# Patient Record
Sex: Male | Born: 1990 | Race: Black or African American | Hispanic: No | Marital: Single | Smoking: Current every day smoker
Health system: Southern US, Community
[De-identification: ages and names within clinical notes are randomized; demographics above are authoritative.]

## PROBLEM LIST (undated history)

## (undated) ENCOUNTER — Emergency Department: Admission: EM | Payer: No Typology Code available for payment source

## (undated) DIAGNOSIS — E119 Type 2 diabetes mellitus without complications: Secondary | ICD-10-CM

---
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
## 2012-08-19 ENCOUNTER — Emergency Department (HOSPITAL_COMMUNITY): Payer: Medicaid Other

## 2012-08-19 ENCOUNTER — Emergency Department (HOSPITAL_COMMUNITY)
Admission: EM | Admit: 2012-08-19 | Discharge: 2012-08-19 | Disposition: A | Discharge status: Home / Self Care | Payer: Medicaid Other | Attending: Emergency Medicine | Admitting: Emergency Medicine

## 2012-08-19 ENCOUNTER — Encounter (HOSPITAL_COMMUNITY): Payer: Self-pay | Admitting: Emergency Medicine

## 2012-08-19 DIAGNOSIS — R599 Enlarged lymph nodes, unspecified: Secondary | ICD-10-CM | POA: Insufficient documentation

## 2012-08-19 DIAGNOSIS — D72829 Elevated white blood cell count, unspecified: Secondary | ICD-10-CM

## 2012-08-19 DIAGNOSIS — N50819 Testicular pain, unspecified: Secondary | ICD-10-CM

## 2012-08-19 DIAGNOSIS — N509 Disorder of male genital organs, unspecified: Secondary | ICD-10-CM | POA: Insufficient documentation

## 2012-08-19 DIAGNOSIS — F17200 Nicotine dependence, unspecified, uncomplicated: Secondary | ICD-10-CM | POA: Insufficient documentation

## 2012-08-19 DIAGNOSIS — R739 Hyperglycemia, unspecified: Secondary | ICD-10-CM

## 2012-08-19 DIAGNOSIS — R7309 Other abnormal glucose: Secondary | ICD-10-CM | POA: Insufficient documentation

## 2012-08-19 DIAGNOSIS — R61 Generalized hyperhidrosis: Secondary | ICD-10-CM | POA: Insufficient documentation

## 2012-08-19 LAB — URINALYSIS, ROUTINE W REFLEX MICROSCOPIC
Glucose, UA: 100 mg/dL — AB
Leukocytes, UA: NEGATIVE
Nitrite: NEGATIVE
Specific Gravity, Urine: 1.031 — ABNORMAL HIGH (ref 1.005–1.030)
pH: 5.5 (ref 5.0–8.0)

## 2012-08-19 LAB — BASIC METABOLIC PANEL
BUN: 10 mg/dL (ref 6–23)
CO2: 28 mEq/L (ref 19–32)
Chloride: 101 mEq/L (ref 96–112)
GFR calc Af Amer: >90 mL/min (ref >90)
Potassium: 4 mEq/L (ref 3.5–5.1)

## 2012-08-19 LAB — CBC WITH DIFFERENTIAL/PLATELET
Basophils Relative: 0 % (ref 0–1)
Eosinophils Absolute: 0.5 10*3/uL (ref 0.0–0.7)
Eosinophils Relative: 4 % (ref 0–5)
Hemoglobin: 14 g/dL (ref 13.0–17.0)
Lymphocytes Relative: 34 % (ref 12–46)
MCHC: 33.9 g/dL (ref 30.0–36.0)
Monocytes Relative: 6 % (ref 3–12)
Neutrophils Relative %: 56 % (ref 43–77)
RBC: 5.11 MIL/uL (ref 4.22–5.81)
WBC: 12.7 10*3/uL — ABNORMAL HIGH (ref 4.0–10.5)

## 2012-08-19 LAB — HEMOGLOBIN A1C: Hgb A1c MFr Bld: 8.4 % — ABNORMAL HIGH (ref <5.7)

## 2012-08-19 LAB — GLUCOSE, CAPILLARY

## 2012-08-19 MED ORDER — CIPROFLOXACIN HCL 500 MG PO TABS: 500.0000 mg | ORAL_TABLET | Freq: Two times a day (BID) | ORAL | Status: DC

## 2012-08-19 MED ORDER — CEFTRIAXONE SODIUM 1 G IJ SOLR
1.0000 g | Freq: Once | INTRAMUSCULAR | Status: AC
  Administered 2012-08-19: 1 g via INTRAMUSCULAR
  Filled 2012-08-19: qty 10

## 2012-08-19 MED ORDER — LIDOCAINE HCL (PF) 1 % IJ SOLN
INTRAMUSCULAR | Status: AC
  Filled 2012-08-19: qty 5

## 2012-08-19 MED ORDER — AZITHROMYCIN 250 MG PO TABS
1000.0000 mg | ORAL_TABLET | Freq: Once | ORAL | Status: AC
  Administered 2012-08-19: 1000 mg via ORAL
  Filled 2012-08-19: qty 4

## 2012-08-19 NOTE — ED Notes (Signed)
Pt returned back from US.

## 2012-08-19 NOTE — ED Notes (Signed)
Pt here with c/o testicular pain that started 5 months ago no swelling, no injury

## 2012-08-19 NOTE — ED Provider Notes (Signed)
History     CSN: [id number]  Arrival date & time 08/19/12  0907   First MD Initiated Contact with Patient 08/19/12 [phone]      Chief Complaint  Patient presents with  . Testicle Pain    (Consider location/radiation/quality/duration/timing/severity/associated sxs/prior treatment) HPI Comments: Alexander Kelley 21 y.o. male   The chief complaint is: Patient presents with:   Testicle Pain    21 year old male presents with chief complaint of testicular pain.  Onset was 5 months ago.  Pain is intermittent.  Never above 5/10.  Pain does not radiate.  Patient denies any mechanism of injury.  Last sexual encounter was 1 year ago.  Patient denies any discharge from the penis, burning with urination, difficulty with urination.  He has noticed some "bumps" in his groin area to denies any vesicular eruptions are painless ulcerations appear.  Patient states that his pain is along the spermatic cords.  He also states that he has daily soaking night sweats, but denies any unexplained weight loss, excessive fatigue.  He denies history of cryptorchidism.    Patient is a 21 y.o. male presenting with testicular pain. The history is provided by the patient. No language interpreter was used.  Testicle Pain The current episode started more than 1 month ago. The problem occurs intermittently. The problem has been gradually improving. Associated symptoms include swollen glands. Pertinent negatives include no abdominal pain, anorexia, arthralgias, change in bowel habit, chest pain, chills, congestion, coughing, diaphoresis, fatigue, fever, headaches, joint swelling, myalgias, nausea, neck pain, numbness, rash, sore throat, urinary symptoms, vertigo, visual change, vomiting or weakness. Nothing aggravates the symptoms. He has tried nothing for the symptoms.    History reviewed. No pertinent past medical history.  History reviewed. No pertinent past surgical history.  History reviewed. No pertinent family  history.  History  Substance Use Topics  . Smoking status: Current Every Day Smoker  . Smokeless tobacco: Not on file  . Alcohol Use: No      Review of Systems  Constitutional: Negative for fever, chills, diaphoresis and fatigue.  HENT: Negative for congestion, sore throat and neck pain.   Respiratory: Negative for cough.   Cardiovascular: Negative for chest pain.  Gastrointestinal: Negative for nausea, vomiting, abdominal pain, anorexia and change in bowel habit.  Genitourinary: Positive for testicular pain.  Musculoskeletal: Negative for myalgias, joint swelling and arthralgias.  Skin: Negative for rash.  Neurological: Negative for vertigo, weakness, numbness and headaches.  All other systems reviewed and are negative.    Allergies  Review of patient's allergies indicates no known allergies.  Home Medications  No current outpatient prescriptions on file.  BP 143/65  Pulse 79  Temp 97.8 F (36.6 C)  Resp 18  SpO2 100%  Physical Exam  Nursing note and vitals reviewed. Constitutional: He appears well-developed and well-nourished. No distress.  HENT:  Head: Normocephalic and atraumatic.  Eyes: Conjunctivae normal are normal. No scleral icterus.  Neck: Normal range of motion. Neck supple.  Cardiovascular: Normal rate, regular rhythm and normal heart sounds.   Pulmonary/Chest: Effort normal and breath sounds normal. No respiratory distress.  Abdominal: Soft. There is no tenderness. Hernia confirmed negative in the right inguinal area and confirmed negative in the left inguinal area.  Genitourinary: Testes normal and penis normal. Cremasteric reflex is present. Right testis shows no mass, no swelling and no tenderness. Right testis is descended. Left testis shows no mass, no swelling and no tenderness. Left testis is descended. Circumcised. No phimosis, paraphimosis, hypospadias, penile erythema  or penile tenderness. No discharge found.       No erythema or discharge  present in the urethral opening. Patient does have palpable lymph nodes in the inguinal chain.  No sores.  No tenderness of the epididymis or spermatic cord.  Testicles are equal in size without tenderness or masses. No palpable inguinal hernias  Musculoskeletal: He exhibits no edema.  Lymphadenopathy:       Right: Inguinal adenopathy present.       Left: Inguinal adenopathy present.  Neurological: He is alert.  Skin: Skin is warm and dry. He is not diaphoretic.  Psychiatric: His behavior is normal.    ED Course  Procedures (including critical care time)  Labs Reviewed  URINALYSIS, ROUTINE W REFLEX MICROSCOPIC - Abnormal; Notable for the following:    Specific Gravity, Urine 1.031 (*)     Glucose, UA 100 (*)     All other components within normal limits  CBC WITH DIFFERENTIAL - Abnormal; Notable for the following:    WBC 12.7 (*)     Lymphs Abs 4.3 (*)     All other components within normal limits  BASIC METABOLIC PANEL - Abnormal; Notable for the following:    Glucose, Bld 271 (*)     All other components within normal limits  GLUCOSE, CAPILLARY - Abnormal; Notable for the following:    Glucose-Capillary 260 (*)     All other components within normal limits  HEMOGLOBIN A1C - Abnormal; Notable for the following:    Hemoglobin A1C 8.4 (*)     Mean Plasma Glucose 194 (*)     All other components within normal limits  LAB REPORT - SCANNED   No results found.   1. Testicular pain   2. Hyperglycemia without ketosis   3. Leukocytosis       MDM  Patient with c/o testicular pain.  His [place] and doppler are negative for any acute abnormality.  Patient does have highly elevated glucose and mother reports strong family history of Diabetes.  I have ordered a HGB A1C which is still pending.  The patient is uninsured and without PCP. I have asked The flow manager to  All the patient with his A1C results  [place].  He will need to return to [facility] in 2 days for f/u and recheck of his  blood glucose. Patient expresses understanding and agrees with plan.        Arthor Captain, PA-C 08/21/12 2000

## 2012-08-19 NOTE — ED Notes (Signed)
Patient transported to Ultrasound 

## 2012-08-19 NOTE — ED Notes (Signed)
Pt's CBG is 260. RN [place] notified.

## 2012-08-19 NOTE — ED Notes (Signed)
Patient informed that Hemoglobin A1C-is 8.4. Patient was instructed to f/u at Urgent Care. Patient stated," he will f/u tomorrow".

## 2012-08-20 ENCOUNTER — Emergency Department (HOSPITAL_COMMUNITY)
Admission: EM | Admit: 2012-08-20 | Discharge: 2012-08-20 | Disposition: A | Discharge status: Home / Self Care | Payer: Medicaid Other | Attending: Emergency Medicine | Admitting: Emergency Medicine

## 2012-08-20 ENCOUNTER — Encounter (HOSPITAL_COMMUNITY): Payer: Self-pay

## 2012-08-20 DIAGNOSIS — E119 Type 2 diabetes mellitus without complications: Secondary | ICD-10-CM

## 2012-08-20 DIAGNOSIS — F17200 Nicotine dependence, unspecified, uncomplicated: Secondary | ICD-10-CM | POA: Insufficient documentation

## 2012-08-20 HISTORY — DX: Type 2 diabetes mellitus without complications: E11.9

## 2012-08-20 LAB — GLUCOSE, CAPILLARY: Glucose-Capillary: 233 mg/dL — ABNORMAL HIGH (ref 70–99)

## 2012-08-20 MED ORDER — METFORMIN HCL 1000 MG PO TABS: 500.0000 mg | ORAL_TABLET | Freq: Two times a day (BID) | ORAL | Status: DC

## 2012-08-20 NOTE — ED Provider Notes (Signed)
History     CSN: [id number]  Arrival date & time 08/20/12  0934   First MD Initiated Contact with Patient 08/20/12 [phone]      No chief complaint on file.   (Consider location/radiation/quality/duration/timing/severity/associated sxs/prior treatment) The history is provided by the patient and a parent. No language interpreter was used.  cc:  Patient here today to have  blood sugar rechecked today.  He was seen in the ER yesterday for testicular pain and put on cipro after u/s r/o testicular torsion.  Incidental findings were hgb AIC elevated.  Patient was told to go to the urgent care for a recheck of his blood sugar today. At urgent care was closed that he came to the ER. Patient has no PCP. Patient's father has diabetic diabetes and is on insulin. Patient is a smoker with no other past medical history. Patient denies nausea vomiting abdominal pain, headaches, muscle aching, polyuria, polydipsia, polyphagia.Marland Kitchen No past medical history on file.  No past surgical history on file.  No family history on file.  History  Substance Use Topics  . Smoking status: Current Every Day Smoker  . Smokeless tobacco: Not on file  . Alcohol Use: No      Review of Systems  Constitutional: Negative.   HENT: Negative.   Eyes: Negative.   Respiratory: Negative.   Cardiovascular: Negative.   Gastrointestinal: Negative.   Neurological: Negative.   Psychiatric/Behavioral: Negative.   All other systems reviewed and are negative.    Allergies  Review of patient's allergies indicates no known allergies.  Home Medications   Current Outpatient Rx  Name  Route  Sig  Dispense  Refill  . CIPROFLOXACIN HCL 500 MG PO TABS   Oral   Take 500 mg by mouth 2 (two) times daily. For 10 days starting on 08/19/12           BP 126/71  Pulse 82  Temp 97.5 F (36.4 C) (Oral)  Resp 20  SpO2 95%  Physical Exam  Nursing note and vitals reviewed. Constitutional: He is oriented to person, place, and  time. He appears well-developed and well-nourished.  HENT:  Head: Normocephalic.  Eyes: Conjunctivae normal and EOM are normal. Pupils are equal, round, and reactive to light.  Neck: Normal range of motion. Neck supple.  Cardiovascular: Normal rate.        No tachycardia  Pulmonary/Chest: Effort normal.       No hyper Ventilation  Abdominal: Soft.  Musculoskeletal: Normal range of motion.  Neurological: He is alert and oriented to person, place, and time.  Skin: Skin is warm and dry.  Psychiatric: He has a normal mood and affect.    ED Course  Procedures (including critical care time)   Labs Reviewed  CBC  BASIC METABOLIC PANEL  URINALYSIS, ROUTINE W REFLEX MICROSCOPIC   US Scrotum  08/19/2012  *RADIOLOGY REPORT*  Clinical Data:  21 year old with testicular discomfort.  SCROTAL ULTRASOUND DOPPLER ULTRASOUND OF THE TESTICLES  Technique: Complete ultrasound examination of the testicles, epididymis, and other scrotal structures was performed.  Color and spectral Doppler ultrasound were also utilized to evaluate blood flow to the testicles.  Comparison:  None  Findings:  Right testis:  Normal echotexture to the right testicle.  No evidence for a testicular mass.  The right testicle measures 4.7 x 2.4 x 3.5 cm.  Small amount of fluid around the right testicle.  Left testis:  Normal appearance of the left testicle without a testicular mass.  Left testicle measures  4.6 x 2.3 x 3.2 cm.  Small amount of fluid around the left testicle.  Right epididymis:  Normal in size and appearance.  Left epididymis:  Normal in size and appearance.  Hydrocele:  Small bilateral hydroceles.  Varicocele:  Absent  Pulsed Doppler interrogation of both testes demonstrates low resistance flow bilaterally.  IMPRESSION: Normal testicular ultrasound.  Small bilateral hydroceles.  No evidence for testicular torsion.   Original Report Authenticated By: Richarda Overlie, M.D.    [place] Art/ven Flow Abd Pelv Doppler  08/19/2012   *RADIOLOGY REPORT*  Clinical Data:  21 year old with testicular discomfort.  SCROTAL ULTRASOUND DOPPLER ULTRASOUND OF THE TESTICLES  Technique: Complete ultrasound examination of the testicles, epididymis, and other scrotal structures was performed.  Color and spectral Doppler ultrasound were also utilized to evaluate blood flow to the testicles.  Comparison:  None  Findings:  Right testis:  Normal echotexture to the right testicle.  No evidence for a testicular mass.  The right testicle measures 4.7 x 2.4 x 3.5 cm.  Small amount of fluid around the right testicle.  Left testis:  Normal appearance of the left testicle without a testicular mass.  Left testicle measures 4.6 x 2.3 x 3.2 cm.  Small amount of fluid around the left testicle.  Right epididymis:  Normal in size and appearance.  Left epididymis:  Normal in size and appearance.  Hydrocele:  Small bilateral hydroceles.  Varicocele:  Absent  Pulsed Doppler interrogation of both testes demonstrates low resistance flow bilaterally.  IMPRESSION: Normal testicular ultrasound.  Small bilateral hydroceles.  No evidence for testicular torsion.   Original Report Authenticated By: Richarda Overlie, M.D.      No diagnosis found.    MDM  Seen yesterday and put on Cipro for testicular pain. Incidental finding was hyperglycemia and ketoacidosis. Patient was here today to have his sugar rechecked.. We will start him on metformin 500 twice a day. Patient will followup with a PCP from the list as soon as possible. He will also keep a record of his CBGs. Patient will return for worsening symptoms such as nausea vomiting abdominal pain hyperventilation. It on diet in management of diabetes while he was in the ER today.         Remi Haggard, NP 08/20/12 1056

## 2012-08-20 NOTE — ED Notes (Signed)
Pt was told to follow up at [facility] today for HGB A1C 8.4.  Pt presents here today for the recheck.

## 2012-08-20 NOTE — ED Provider Notes (Signed)
Medical screening examination/treatment/procedure(s) were performed by non-physician practitioner and as supervising physician I was immediately available for consultation/collaboration.   Gavin Pound. Alekai Pocock, MD 08/20/12 1057

## 2012-08-24 NOTE — ED Provider Notes (Signed)
Medical screening examination/treatment/procedure(s) were performed by non-physician practitioner and as supervising physician I was immediately available for consultation/collaboration.    Morrie Daywalt L Dekendrick Uzelac, MD 08/24/12 1911 

## 2013-07-26 IMAGING — US US ART/VEN ABD/PELV/SCROTUM DOPPLER LTD
1 series · 14 of 25 positions shown · non-contrast
Comparison: None

CLINICAL DATA: 21-year-old with testicular discomfort.

SCROTAL ULTRASOUND
DOPPLER ULTRASOUND OF THE TESTICLES
TECHNIQUE: Complete ultrasound examination of the testicles,
epididymis, and other scrotal structures was performed.  Color and
spectral Doppler ultrasound were also utilized to evaluate blood
flow to the testicles.

[Series 1: us art/ven abd/pelv/scrotum doppler ltd · 0.08mm/px · 14 of 43 slices shown]
[im 1/43]
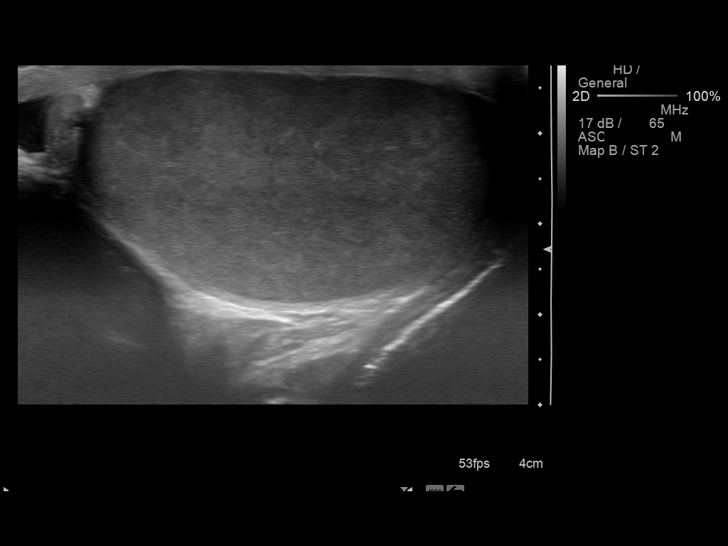
[im 4/43]
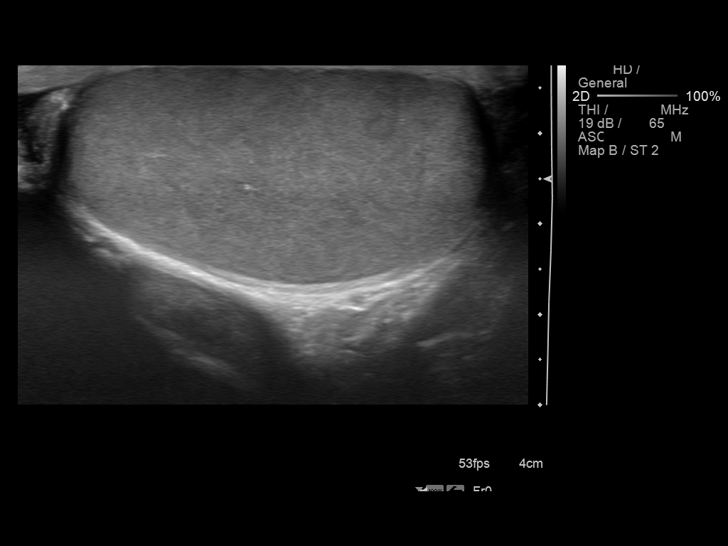
[im 8/43]
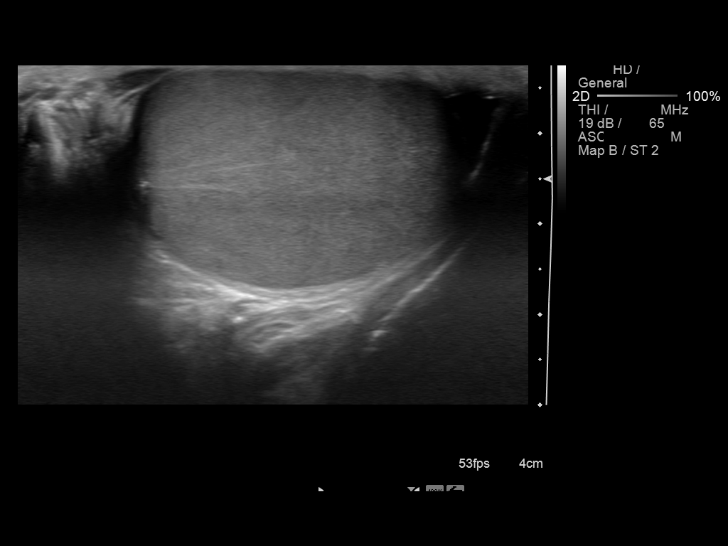
[im 11/43]
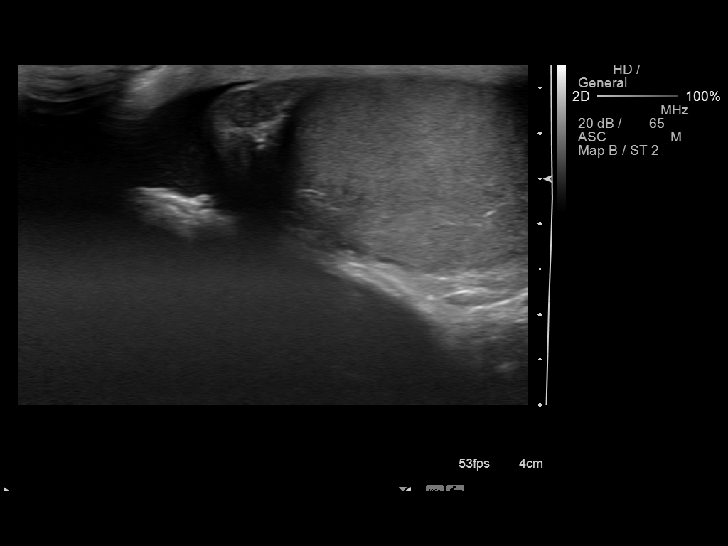
[im 15/43]
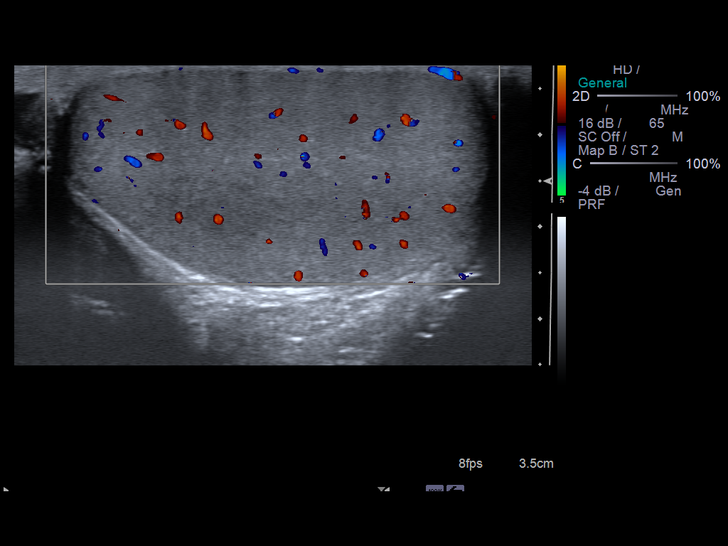
[im 16/43]
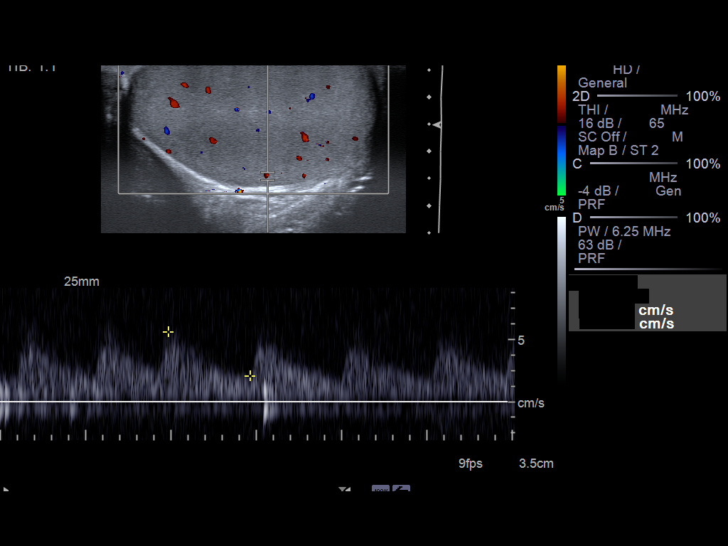
[im 20/43]
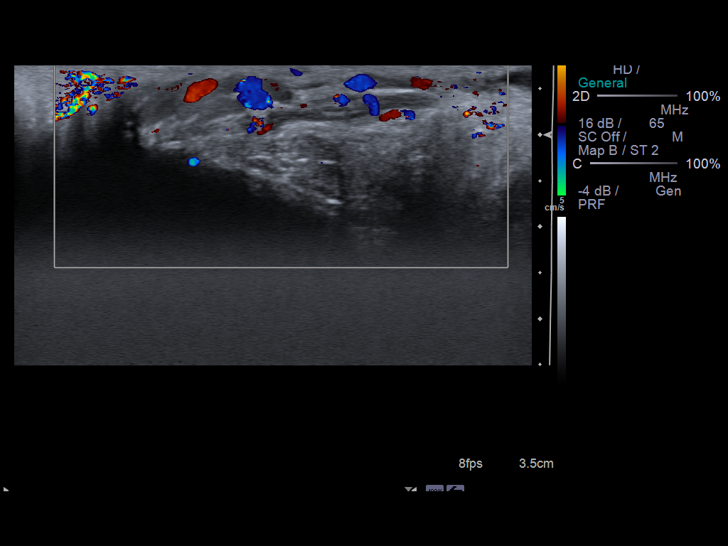
[im 23/43]
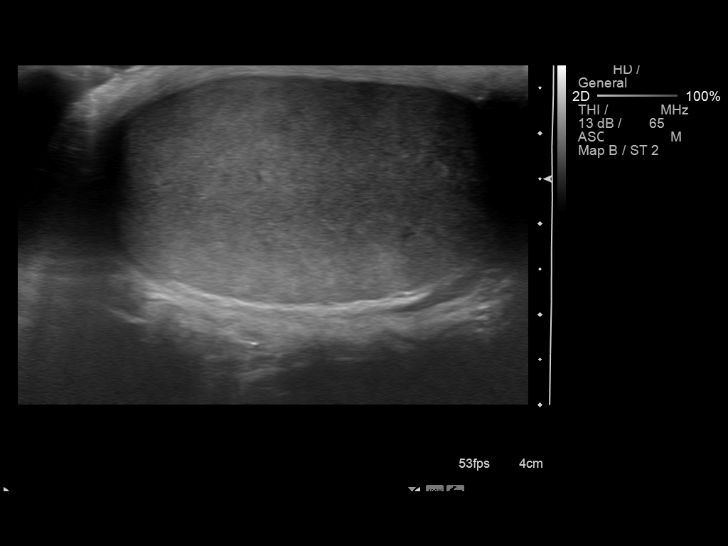
[im 27/43]
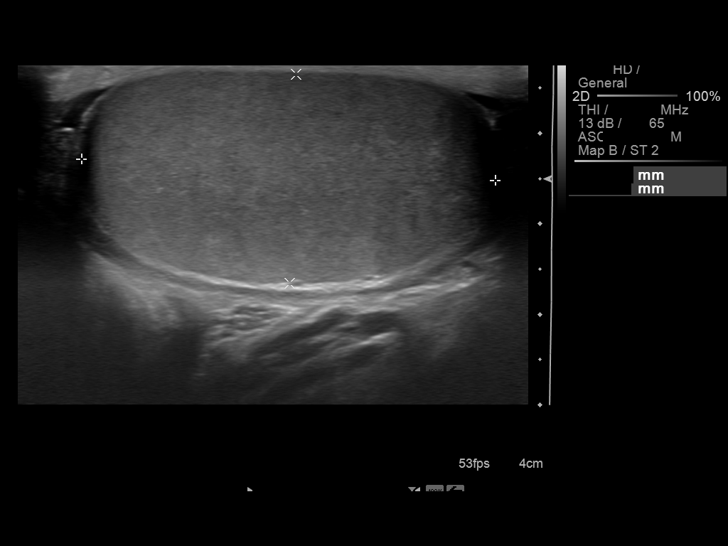
[im 29/43]
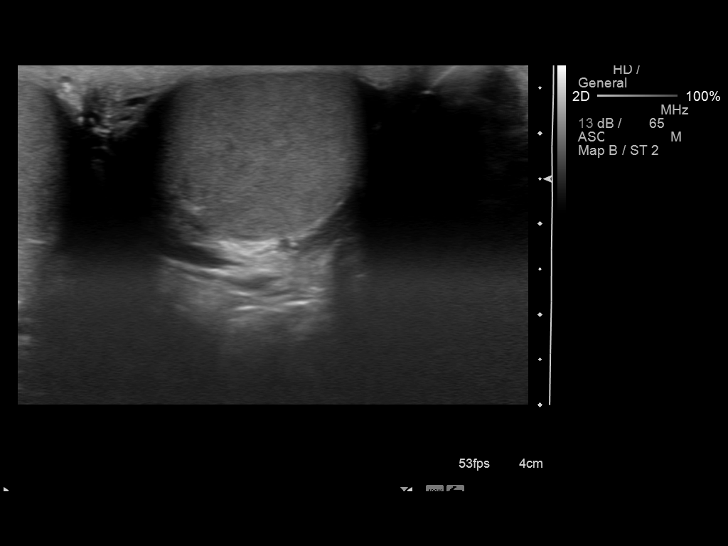
[im 32/43]
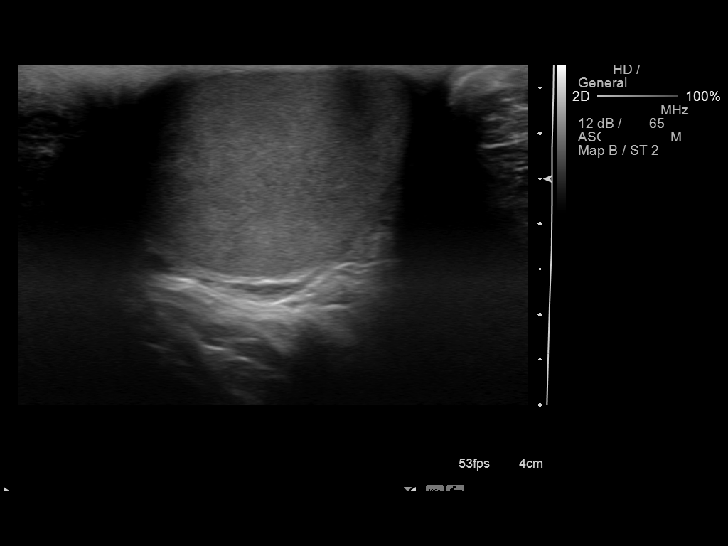
[im 36/43]
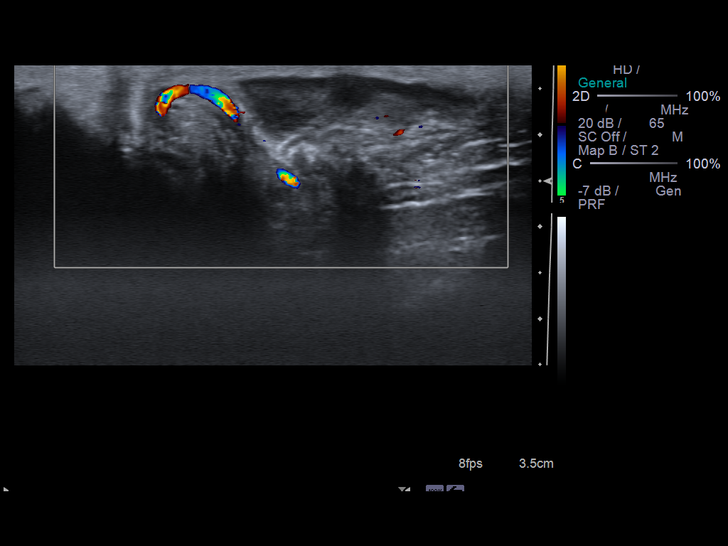
[im 39/43]
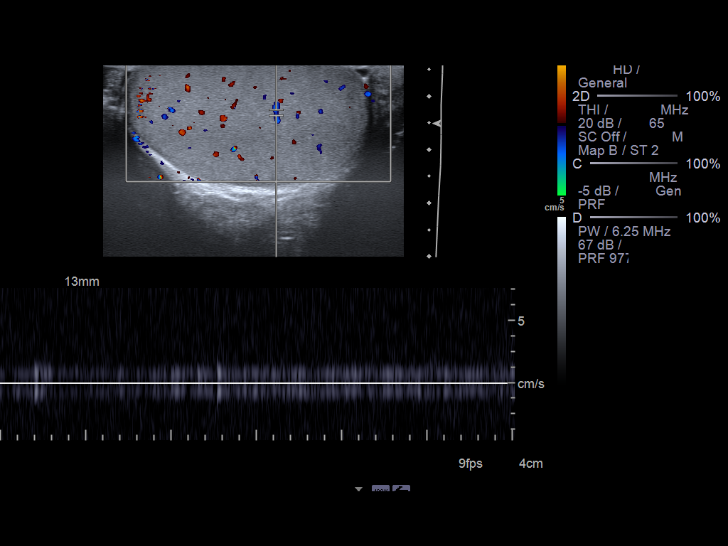
[im 43/43]
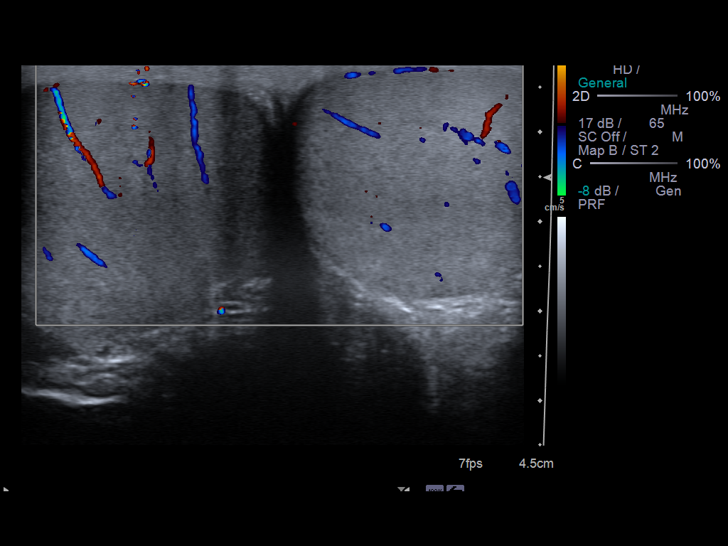

[14 of 25 positions shown; findings below may reference images not displayed]

FINDINGS: Right testis:  Normal echotexture to the right testicle.  No
evidence for a testicular mass.  The right testicle measures 4.7 x
2.4 x 3.5 cm.  Small amount of fluid around the right testicle.

Left testis:  Normal appearance of the left testicle without a
testicular mass.  Left testicle measures 4.6 x 2.3 x 3.2 cm.  Small
amount of fluid around the left testicle.

Right epididymis:  Normal in size and appearance.

Left epididymis:  Normal in size and appearance.

Hydrocele:  Small bilateral hydroceles.

Varicocele:  Absent

Pulsed Doppler interrogation of both testes demonstrates low
resistance flow bilaterally.
IMPRESSION: Normal testicular ultrasound.  Small bilateral hydroceles.

No evidence for testicular torsion.

## 2019-01-11 ENCOUNTER — Encounter: Payer: Self-pay | Admitting: Emergency Medicine

## 2019-01-11 ENCOUNTER — Emergency Department
Admission: EM | Admit: 2019-01-11 | Discharge: 2019-01-11 | Disposition: A | Discharge status: Home / Self Care | Payer: Self-pay | Attending: Emergency Medicine | Admitting: Emergency Medicine

## 2019-01-11 ENCOUNTER — Other Ambulatory Visit: Payer: Self-pay

## 2019-01-11 DIAGNOSIS — K0889 Other specified disorders of teeth and supporting structures: Secondary | ICD-10-CM

## 2019-01-11 DIAGNOSIS — K0381 Cracked tooth: Secondary | ICD-10-CM | POA: Insufficient documentation

## 2019-01-11 DIAGNOSIS — S025XXA Fracture of tooth (traumatic), initial encounter for closed fracture: Secondary | ICD-10-CM

## 2019-01-11 MED ORDER — HYDROCODONE-ACETAMINOPHEN 5-325 MG PO TABS: 1.0000 | ORAL_TABLET | Freq: Four times a day (QID) | ORAL | 0 refills | Status: DC | PRN

## 2019-01-11 MED ORDER — AMOXICILLIN 500 MG PO CAPS
500.0000 mg | ORAL_CAPSULE | Freq: Once | ORAL | Status: AC
  Administered 2019-01-11: 500 mg via ORAL
  Filled 2019-01-11: qty 1

## 2019-01-11 MED ORDER — IBUPROFEN 600 MG PO TABS: 600.0000 mg | ORAL_TABLET | Freq: Three times a day (TID) | ORAL | 0 refills | Status: DC | PRN

## 2019-01-11 MED ORDER — IBUPROFEN 600 MG PO TABS
600.0000 mg | ORAL_TABLET | Freq: Once | ORAL | Status: AC
  Administered 2019-01-11: 600 mg via ORAL
  Filled 2019-01-11: qty 1

## 2019-01-11 MED ORDER — HYDROCODONE-ACETAMINOPHEN 5-325 MG PO TABS
1.0000 | ORAL_TABLET | Freq: Once | ORAL | Status: AC
  Administered 2019-01-11: 06:00:00 1 via ORAL
  Filled 2019-01-11: qty 1

## 2019-01-11 MED ORDER — AMOXICILLIN 500 MG PO CAPS
500.0000 mg | ORAL_CAPSULE | Freq: Three times a day (TID) | ORAL | 0 refills | Status: DC
Start: 2019-01-11 — End: 2020-01-03

## 2019-01-11 NOTE — ED Notes (Signed)
Reviewed discharge instructions, follow-up care, and prescriptions with patient. Patient verbalized understanding of all information reviewed. Patient stable, with no distress noted at this time.    

## 2019-01-11 NOTE — ED Triage Notes (Signed)
Patient with complaint of left lower dental pain times 3 days.

## 2019-01-11 NOTE — ED Provider Notes (Signed)
[facility] Emergency Department Provider Note   ____________________________________________   First MD Initiated Contact with Patient 01/11/19 0542     (approximate)  I have reviewed the triage vital signs and the nursing notes.   HISTORY  Chief Complaint Dental Pain    HPI Alexander Kelley is a 28 y.o. male who presents to the ED from home with a chief complaint of dental pain.  Patient has a pre-existing cracked tooth which began to bother him 3 days ago.  Complains of pain to his left lower molar.  Denies associated fever, cough, chest pain, shortness of breath, abdominal pain, nausea or vomiting.  Denies recent travel, trauma or exposure to persons diagnosed with coronavirus.       Past Medical History:  Diagnosis Date  . Diabetes mellitus without complication (HCC)     There are no active problems to display for this patient.   History reviewed. No pertinent surgical history.  Prior to Admission medications   Medication Sig Start Date End Date Taking? Authorizing Provider  amoxicillin (AMOXIL) 500 MG capsule Take 1 capsule (500 mg total) by mouth 3 (three) times daily. 01/11/19   Irean HongSung,  J, MD  ciprofloxacin (CIPRO) 500 MG tablet Take 500 mg by mouth 2 (two) times daily. For 10 days starting on 08/19/12    [provider]  HYDROcodone-acetaminophen (NORCO) 5-325 MG tablet Take 1 tablet by mouth every 6 (six) hours as needed for moderate pain. 01/11/19   Irean HongSung,  J, MD  ibuprofen (ADVIL) 600 MG tablet Take 1 tablet (600 mg total) by mouth every 8 (eight) hours as needed. 01/11/19   Irean HongSung,  J, MD  metFORMIN (GLUCOPHAGE) 1000 MG tablet Take 0.5 tablets (500 mg total) by mouth 2 (two) times daily. 08/20/12   Jethro Bastosrawford, Anne W, NP    Allergies Patient has no known allergies.  No family history on file.  Social History Social History   Tobacco Use  . Smoking status: Current Every Day Smoker  . Smokeless tobacco: Never Used   Substance Use Topics  . Alcohol use: Yes  . Drug use: No    Review of Systems  Constitutional: No fever/chills Eyes: No visual changes. ENT: Positive for dental pain.  No sore throat. Cardiovascular: Denies chest pain. Respiratory: Denies shortness of breath. Gastrointestinal: No abdominal pain.  No nausea, no vomiting.  No diarrhea.  No constipation. Genitourinary: Negative for dysuria. Musculoskeletal: Negative for back pain. Skin: Negative for rash. Neurological: Negative for headaches, focal weakness or numbness.   ____________________________________________   PHYSICAL EXAM:  VITAL SIGNS: ED Triage Vitals  Enc Vitals Group     BP 01/11/19 0535 132/87     Pulse Rate 01/11/19 0535 74     Resp 01/11/19 0535 16     Temp 01/11/19 0535 97.6 F (36.4 C)     Temp Source 01/11/19 0535 Oral     SpO2 01/11/19 0535 100 %     Weight 01/11/19 0532 160 lb (72.6 kg)     Height 01/11/19 0532 5\' 9"  (1.753 m)     Head Circumference --      Peak Flow --      Pain Score 01/11/19 0532 9     Pain Loc --      Pain Edu? --      Excl. in GC? --     Constitutional: Alert and oriented. Well appearing and in no acute distress. Eyes: Conjunctivae are normal. PERRL. EOMI. Head: Atraumatic. Nose: No  congestion/rhinnorhea. Mouth/Throat: Mucous membranes are moist.  Left lower posterior molar with pre-existing crack which is tender to palpation with tongue blade. Neck: No stridor.   Cardiovascular: Normal rate, regular rhythm. Grossly normal heart sounds.  Good peripheral circulation. Respiratory: Normal respiratory effort.  No retractions. Lungs CTAB. Gastrointestinal: Soft and nontender. No distention. No abdominal bruits. No CVA tenderness. Musculoskeletal: No lower extremity tenderness nor edema.  No joint effusions. Neurologic:  Normal speech and language. No gross focal neurologic deficits are appreciated. No gait instability. Skin:  Skin is warm, dry and intact. No rash noted.  Psychiatric: Mood and affect are normal. Speech and behavior are normal.  ____________________________________________   LABS (all labs ordered are listed, but only abnormal results are displayed)  Labs Reviewed - No data to display ____________________________________________  EKG  None ____________________________________________  RADIOLOGY  ED MD interpretation: None  Official radiology report(s): No results found.  ____________________________________________   PROCEDURES  Procedure(s) performed (including Critical Care):  Procedures   ____________________________________________   INITIAL IMPRESSION / ASSESSMENT AND PLAN / ED COURSE  As part of my medical decision making, I reviewed the following data within the electronic MEDICAL RECORD NUMBER Nursing notes reviewed and incorporated and Notes from prior ED visits        [name] was evaluated in Emergency Department on 01/11/2019 for the symptoms described in the history of present illness. He was evaluated in the context of the global COVID-19 pandemic, which necessitated consideration that the patient might be at risk for infection with the SARS-CoV-2 virus that causes COVID-19. Institutional protocols and algorithms that pertain to the evaluation of patients at risk for COVID-19 are in a state of rapid change based on information released by regulatory bodies including the CDC and federal and state organizations. These policies and algorithms were followed during the patient's care in the ED.  28 year old male who presents with dentalgia secondary to pre-existing chipped tooth.  Will treat with antibiotics, NSAIDs, analgesia and provide dental clinic referral.  Strict return precautions given.  Patient verbalizes understanding and agrees with plan of care.      ____________________________________________   FINAL CLINICAL IMPRESSION(S) / ED DIAGNOSES  Final diagnoses:  Toothache  Closed fracture of  tooth, initial encounter     ED Discharge Orders         Ordered    amoxicillin (AMOXIL) 500 MG capsule  3 times daily     01/11/19 0547    ibuprofen (ADVIL) 600 MG tablet  Every 8 hours PRN     01/11/19 0547    HYDROcodone-acetaminophen (NORCO) 5-325 MG tablet  Every 6 hours PRN     01/11/19 0547           Note:  This document was prepared using Dragon voice recognition software and may include unintentional dictation errors.   Irean Hong, MD 01/11/19 [phone]

## 2019-01-11 NOTE — Discharge Instructions (Signed)
1.  Take antibiotic as prescribed (Amoxicillin 500 mg 3 times daily x7 days). 2.  You may take pain medicines as needed (Motrin/Norco). 3.  Return to the ER for worsening symptoms, persistent vomiting, difficulty breathing or other concerns.

## 2020-01-03 ENCOUNTER — Other Ambulatory Visit: Payer: Self-pay

## 2020-01-03 ENCOUNTER — Encounter: Payer: Self-pay | Admitting: Emergency Medicine

## 2020-01-03 ENCOUNTER — Emergency Department
Admission: EM | Admit: 2020-01-03 | Discharge: 2020-01-03 | Disposition: A | Discharge status: Home / Self Care | Payer: No Typology Code available for payment source | Attending: Emergency Medicine | Admitting: Emergency Medicine

## 2020-01-03 DIAGNOSIS — Z7984 Long term (current) use of oral hypoglycemic drugs: Secondary | ICD-10-CM | POA: Diagnosis not present

## 2020-01-03 DIAGNOSIS — R0789 Other chest pain: Secondary | ICD-10-CM | POA: Insufficient documentation

## 2020-01-03 DIAGNOSIS — Y999 Unspecified external cause status: Secondary | ICD-10-CM | POA: Insufficient documentation

## 2020-01-03 DIAGNOSIS — E119 Type 2 diabetes mellitus without complications: Secondary | ICD-10-CM | POA: Diagnosis not present

## 2020-01-03 DIAGNOSIS — F17200 Nicotine dependence, unspecified, uncomplicated: Secondary | ICD-10-CM | POA: Insufficient documentation

## 2020-01-03 DIAGNOSIS — M7918 Myalgia, other site: Secondary | ICD-10-CM

## 2020-01-03 DIAGNOSIS — Y9389 Activity, other specified: Secondary | ICD-10-CM | POA: Insufficient documentation

## 2020-01-03 DIAGNOSIS — Y92410 Unspecified street and highway as the place of occurrence of the external cause: Secondary | ICD-10-CM | POA: Diagnosis not present

## 2020-01-03 MED ORDER — IBUPROFEN 600 MG PO TABS: 600.0000 mg | ORAL_TABLET | Freq: Three times a day (TID) | ORAL | 0 refills | Status: AC | PRN

## 2020-01-03 MED ORDER — TRAMADOL HCL 50 MG PO TABS: 50.0000 mg | ORAL_TABLET | Freq: Four times a day (QID) | ORAL | 0 refills | Status: AC | PRN

## 2020-01-03 MED ORDER — CYCLOBENZAPRINE HCL 10 MG PO TABS: 10.0000 mg | ORAL_TABLET | Freq: Three times a day (TID) | ORAL | 0 refills | Status: AC | PRN
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
## 2020-01-03 NOTE — ED Notes (Signed)
Repeat EKG per Dr. Erma Heritage was completed @ 0830.

## 2020-01-03 NOTE — Discharge Instructions (Signed)
Follow discharge care instruction take medication as directed.  Be advised medication may cause drowsiness. 

## 2020-04-11 ENCOUNTER — Telehealth: Payer: Self-pay

## 2020-04-11 NOTE — Telephone Encounter (Signed)
Individual has been contacted 3+ times regarding ED referral. No further attempts to contact individual will be made. 

## 2021-02-26 ENCOUNTER — Other Ambulatory Visit: Payer: Self-pay

## 2021-02-26 ENCOUNTER — Emergency Department
Admission: EM | Admit: 2021-02-26 | Discharge: 2021-02-26 | Disposition: A | Discharge status: Home / Self Care | Payer: Self-pay | Attending: Emergency Medicine | Admitting: Emergency Medicine

## 2021-02-26 ENCOUNTER — Encounter: Payer: Self-pay | Admitting: Emergency Medicine

## 2021-02-26 DIAGNOSIS — R1115 Cyclical vomiting syndrome unrelated to migraine: Secondary | ICD-10-CM | POA: Insufficient documentation

## 2021-02-26 DIAGNOSIS — Z5321 Procedure and treatment not carried out due to patient leaving prior to being seen by health care provider: Secondary | ICD-10-CM | POA: Insufficient documentation

## 2021-02-26 NOTE — ED Triage Notes (Signed)
Pt arrived via [facility] from home with reports of vomiting since Fri. Pt was seen at [facility] ED around 930pm.  Pt dx with cyclical vomiting. Rx'd zofran, prilosec and GI cocktail with no relief of sxs. Pt dry heaving in triage occasionally.

## 2021-02-26 NOTE — ED Notes (Signed)
After triage completed, advised patient that lab work would be completed, pt stood up and said he was going to go.

## 2021-05-01 ENCOUNTER — Other Ambulatory Visit: Payer: Self-pay

## 2021-05-01 ENCOUNTER — Ambulatory Visit: Payer: Self-pay | Admitting: Physician Assistant

## 2021-05-01 DIAGNOSIS — Z113 Encounter for screening for infections with a predominantly sexual mode of transmission: Secondary | ICD-10-CM

## 2021-05-01 DIAGNOSIS — Z299 Encounter for prophylactic measures, unspecified: Secondary | ICD-10-CM

## 2021-05-01 LAB — GRAM STAIN

## 2021-05-01 MED ORDER — DOXYCYCLINE HYCLATE 100 MG PO TABS: 100.0000 mg | ORAL_TABLET | Freq: Two times a day (BID) | ORAL | 0 refills | Status: AC

## 2021-05-02 ENCOUNTER — Encounter: Payer: Self-pay | Admitting: Physician Assistant

## 2021-05-03 ENCOUNTER — Encounter: Payer: Self-pay | Admitting: Physician Assistant

## 2021-05-03 NOTE — Progress Notes (Signed)
[facility] Department STI clinic/screening visit  Subjective:  Alexander Kelley is a 30 y.o. male being seen today for an STI screening visit. The patient reports they do have symptoms.    Patient has the following medical conditions:  There are no problems to display for this patient.    Chief Complaint  Patient presents with   SEXUALLY TRANSMITTED DISEASE    screening    HPI  Patient reports that he has had a "rash" for 1 month and describes it as "looks like burns".  Patient also reports that he has swollen lymph nodes.  Denies other symptoms, surgeries and regular medicines.  Patient reports that he has DM and controls this with diet.  States last HIV test was 03/2021 and last void prior to sample collection for Gram stain was less than 2 hr ago.    See flowsheet for further details and programmatic requirements.    The following portions of the patient's history were reviewed and updated as appropriate: allergies, current medications, past medical history, past social history, past surgical history and problem list.  Objective:  There were no vitals filed for this visit.  Physical Exam Constitutional:      General: He is not in acute distress.    Appearance: Normal appearance.  HENT:     Head: Normocephalic and atraumatic.     Comments: No nits,lice, or hair loss. No cervical, supraclavicular or axillary adenopathy.     Mouth/Throat:     Mouth: Mucous membranes are moist.     Pharynx: Oropharynx is clear. No oropharyngeal exudate or posterior oropharyngeal erythema.  Eyes:     Conjunctiva/sclera: Conjunctivae normal.  Pulmonary:     Effort: Pulmonary effort is normal.  Abdominal:     Palpations: Abdomen is soft. There is no mass.     Tenderness: There is no abdominal tenderness. There is no guarding or rebound.  Genitourinary:    Penis: Normal.      Testes: Normal.     Comments: Pubic area without nits, lice, hair loss, edema, erythema or  lesions. Bilateral inguinal adenopathy with R side larger than L side. Penis circumcised without rash and discharge at meatus. On left side at the base of the penis 1 ~5 mm scabbed area, nt, non-ulcerative area and on right side of scrotum 1 ~4 mm pinkish, healed, nt non-ulcerative area. Testicles descended bilaterally,nt, no masses or edema.  Musculoskeletal:     Cervical back: Neck supple. No tenderness.  Skin:    General: Skin is warm and dry.     Findings: No bruising, erythema, lesion or rash.  Neurological:     Mental Status: He is alert and oriented to person, place, and time.  Psychiatric:        Mood and Affect: Mood normal.        Behavior: Behavior normal.        Thought Content: Thought content normal.        Judgment: Judgment normal.      Assessment and Plan:  Alexander Kelley is a 30 y.o. male presenting to the [facility] Department for STI screening  1. Screening for STD (sexually transmitted disease) Patient into clinic with symptoms. Reviewed with patient that Gram stain is negative today. Rec condoms with all sex. Await test results.  Counseled that RN will call if needs to RTC for treatment once results are back.  - Gram stain - Gonococcus culture - HIV [facility] - Syphilis Serology,  [place] [facility]  2. Prophylactic measure Will cover patient for NGU, Chancroid and early Syphilis with Doxycycline 100 mg #28 1 po BID for 14 days. No sex for 21 days and until results are back and partners have been treated. Stressed to patient importance of completing all medicine as directed. Call with questions and concerns. - doxycycline (VIBRA-TABS) 100 MG tablet; Take 1 tablet (100 mg total) by mouth 2 (two) times daily for 14 days.  Dispense: 28 tablet; Refill: 0     No follow-ups on file.  No future appointments.  Matt Holmes, PA

## 2021-05-04 LAB — HM HIV SCREENING LAB: HM HIV Screening: NEGATIVE

## 2021-05-05 LAB — GONOCOCCUS CULTURE

## 2021-05-05 NOTE — Progress Notes (Signed)
Chart reviewed by Pharmacist  Suzanne Walker PharmD, Contract Pharmacist at [facility]  

## 2021-11-20 ENCOUNTER — Other Ambulatory Visit: Payer: Self-pay

## 2021-11-20 ENCOUNTER — Ambulatory Visit: Payer: Self-pay | Admitting: Family Medicine

## 2021-11-20 ENCOUNTER — Encounter: Payer: Self-pay | Admitting: Family Medicine

## 2021-11-20 DIAGNOSIS — N341 Nonspecific urethritis: Secondary | ICD-10-CM

## 2021-11-20 DIAGNOSIS — Z113 Encounter for screening for infections with a predominantly sexual mode of transmission: Secondary | ICD-10-CM

## 2021-11-20 DIAGNOSIS — J029 Acute pharyngitis, unspecified: Secondary | ICD-10-CM

## 2021-11-20 LAB — GRAM STAIN

## 2021-11-20 LAB — HM HIV SCREENING LAB: HM HIV Screening: NEGATIVE

## 2021-11-20 MED ORDER — DOXYCYCLINE HYCLATE 100 MG PO TABS: 100.0000 mg | ORAL_TABLET | Freq: Two times a day (BID) | ORAL | 0 refills | Status: AC

## 2021-11-20 NOTE — Progress Notes (Signed)
[facility] Department STI clinic/screening visit  Subjective:  Alexander Kelley is a 31 y.o. male being seen today for an STI screening visit. The patient reports they do have symptoms.    Patient has the following medical conditions:  There are no problems to display for this patient.    Chief Complaint  Patient presents with   SEXUALLY TRANSMITTED DISEASE    Screening     HPI  Patient reports here for screening, has had a sore throat and not feeling well since December.  Pt went to [facility] on 11/04/21 for s/sx   Does the patient or their partner desires a pregnancy in the next year? No  Screening for MPX risk: Does the patient have an unexplained rash? No Is the patient MSM? No Does the patient endorse multiple sex partners or anonymous sex partners? Yes Did the patient have close or sexual contact with a person diagnosed with MPX? No Has the patient traveled outside the [place] where MPX is endemic? No Is there a high clinical suspicion for MPX-- evidenced by one of the following No  -Unlikely to be chickenpox  -Lymphadenopathy  -Rash that present in same phase of evolution on any given body part   See flowsheet for further details and programmatic requirements.    The following portions of the patient's history were reviewed and updated as appropriate: allergies, current medications, past medical history, past social history, past surgical history and problem list.  Objective:  There were no vitals filed for this visit.  Physical Exam Constitutional:      Appearance: Normal appearance.  HENT:     Head: Normocephalic.     Mouth/Throat:     Mouth: Mucous membranes are moist.     Pharynx: Oropharynx is clear. No oropharyngeal exudate.     Comments: Off white coating on tongue and enlarged taste buds  Pulmonary:     Effort: Pulmonary effort is normal.  Genitourinary:    Penis: Normal.      Testes: Normal.     Comments: No lice, nits, or pest, no lesions or odor  discharge.  Denies pain or tenderness with paplation of testicles.  No lesions, ulcers or masses present.    Musculoskeletal:     Cervical back: Normal range of motion.  Lymphadenopathy:     Cervical: No cervical adenopathy.     Lower Body: Right inguinal adenopathy present. Left inguinal adenopathy present.  Skin:    General: Skin is warm and dry.     Findings: No bruising, erythema, lesion or rash.  Neurological:     Mental Status: He is alert.  Psychiatric:        Mood and Affect: Mood normal.        Behavior: Behavior normal.      Assessment and Plan:  Alexander Kelley is a 31 y.o. male presenting to the [facility] Department for STI screening  1. Screening examination for venereal disease Patient does have STI symptoms Patient accepted all screenings including  gram stain, oral, urethral GC and bloodwork for HIV/RPR.  Patient meets criteria for HepB screening? Yes. Ordered? No - declined  Patient meets criteria for HepC screening? Yes. Ordered? No - declined  Recommended condom use with all sex Discussed importance of condom use for STI prevent  Treat gram stain per standing order Discussed time line for [facility] results and that patient will be called with positive results and encouraged patient to call if he had not heard in  2 weeks Recommended returning for continued or worsening symptoms.   - Gonococcus culture - Gram stain - HIV [facility] - Syphilis Serology, [place] - Gonococcus culture  2. NGU (nongonococcal urethritis) Gram stain positive  - doxycycline (VIBRA-TABS) 100 MG tablet; Take 1 tablet (100 mg total) by mouth 2 (two) times daily for 7 days.  Dispense: 14 tablet; Refill: 0  3. Throat soreness Pt reports being seen at [facility] and was tested for Strep Throat, result was negative but pt still has s/sx.  Patient reports having trouble with RX that was sent to pharmacy but did not contact clinic to have medication corrected. Encouraged pt to  follow back up with UC about RX.       Return for as needed.  No future appointments.  Wendi Snipes, FNP

## 2021-11-25 LAB — GONOCOCCUS CULTURE

## 2021-11-26 NOTE — Progress Notes (Signed)
Chart reviewed by Pharmacist  Suzanne Walker PharmD, Contract Pharmacist at [facility]  

## 2021-11-29 ENCOUNTER — Encounter: Payer: Self-pay | Admitting: Family Medicine

## 2021-11-29 NOTE — Progress Notes (Unsigned)
Reviewed state lab results received 3/8 which showed RPR 1:64 with reactice Tpal antibody.  ? ?Patient had negative RPR in 04/2021 and thus this new titer with confirmatory testing is an early/recent syphilis infection.  ? ?Routed chart to Doy Mince RN to call patient to have him come to clinic for treatment ASAP with 2.4 milliunits of bicillin IM.  ? ?Patient does not have any known allergies.  ? ? ?Caren Macadam, MD, MPH, ABFM ?Medical Director  ?[place] Department ? ?

## 2021-11-30 ENCOUNTER — Telehealth: Payer: Self-pay

## 2021-11-30 NOTE — Telephone Encounter (Addendum)
Calling pt per Dr. Cala Bradford Newton's order; see 11/29/21 documentation. ? ?Phone call to pt at [phone]. Left message on voicemail that RN with [facility] is calling regarding TR. Please give me a call back as soon as possible. Call [phone] to speak with Isadora Delorey. ? ? ?Phone call to [phone]. Received message that number is not in service. Unable to leave message. ?Tried twice. ? ?MyChart message sent also. ? ?(See scanned copy of TR for Dr. [place] Blas order for tx of early syphilis; Bicillin 2.4 MU.) ? ?

## 2021-12-04 NOTE — Telephone Encounter (Signed)
Phone call to pt at [phone]. Left message on voicemail for pt to call Laci Frenkel t [phone]. ? ?Called right back and pt picked up phone.  ?Pt confirmed identity.  Pt counseled regarding syphilis result and doctor's order for tx. ? ?Pt states he is available and can come in for tx tomorrow morning, scheduled for 12/05/21. ? ?

## 2021-12-04 NOTE — Telephone Encounter (Signed)
Late Entry: ?12/03/21 Shawn Stall, DIS, informed about syphilis result.  DIS will try to contact pt also. ?

## 2021-12-05 ENCOUNTER — Ambulatory Visit: Payer: Self-pay | Admitting: Nurse Practitioner

## 2021-12-05 ENCOUNTER — Other Ambulatory Visit: Payer: Self-pay

## 2021-12-05 ENCOUNTER — Ambulatory Visit: Payer: Self-pay

## 2021-12-05 DIAGNOSIS — E119 Type 2 diabetes mellitus without complications: Secondary | ICD-10-CM | POA: Insufficient documentation

## 2021-12-05 DIAGNOSIS — A539 Syphilis, unspecified: Secondary | ICD-10-CM

## 2021-12-05 DIAGNOSIS — Z113 Encounter for screening for infections with a predominantly sexual mode of transmission: Secondary | ICD-10-CM

## 2021-12-05 MED ORDER — PENICILLIN G BENZATHINE 1200000 UNIT/2ML IM SUSY
2.4000 10*6.[IU] | PREFILLED_SYRINGE | Freq: Once | INTRAMUSCULAR | Status: AC
  Administered 2021-12-05: 2.4 10*6.[IU] via INTRAMUSCULAR

## 2021-12-05 NOTE — Progress Notes (Signed)
Pt here for treatment of Syphilis only.  Bicillin 2.4 MU given IM without any complications.  Berdie Ogren, RN ? ?

## 2021-12-05 NOTE — Telephone Encounter (Signed)
12/05/21 kept provider appt. Per clinic RN, pt did fine with injections. ?

## 2021-12-05 NOTE — Progress Notes (Signed)
[facility] Department ?STI clinic/screening visit ? ?Subjective:  ?Alexander Kelley is a 31 y.o. male being seen today for an STI screening visit. The patient reports they do not have symptoms.   ? ?Patient has the following medical conditions:   ?Patient Active Problem List  ? Diagnosis Date Noted  ? Diabetes mellitus without complication ([place]) 123456  ? ? ? ?Chief Complaint  ?Patient presents with  ? SEXUALLY TRANSMITTED DISEASE  ?  Treatment for Syphilis  ? ? ?HPI ? ?Patient reports to clinic today for treatment of Syphilis.  Patient had a STD visit on 11/20/21 and received a notification that his Syphilis results were positive at 1:64 with a confirmatory reactive as well.  Patient had a previous RPR at [facility] on 05/02/21 that was non-reactive.   ? ?Does the patient or their partner desires a pregnancy in the next year? No ? ?Screening for MPX risk: ?Does the patient have an unexplained rash? No ?Is the patient MSM? No ?Does the patient endorse multiple sex partners or anonymous sex partners? No ?Did the patient have close or sexual contact with a person diagnosed with MPX? No ?Has the patient traveled outside the [place] where MPX is endemic? No ?Is there a high clinical suspicion for MPX-- evidenced by one of the following No ? -Unlikely to be chickenpox ? -Lymphadenopathy ? -Rash that present in same phase of evolution on any given body part ? ? ?See flowsheet for further details and programmatic requirements.  ? ? ?The following portions of the patient's history were reviewed and updated as appropriate: allergies, current medications, past medical history, past social history, past surgical history and problem list. ? ?Objective:  ?There were no vitals filed for this visit. ? ?Physical Exam ?Constitutional:   ?   Appearance: Normal appearance.  ?HENT:  ?   Head: Normocephalic.  ?   Right Ear: External ear normal.  ?   Left Ear: External ear normal.  ?   Nose: Nose normal.  ?   Mouth/Throat:  ?   Mouth:  Mucous membranes are moist.  ?   Comments: No visible signs for dental caries.  ?Pulmonary:  ?   Effort: Pulmonary effort is normal.  ?Abdominal:  ?   General: Abdomen is flat.  ?   Palpations: Abdomen is soft.  ?Genitourinary: ?   Comments: Deferred, declines genital exam ?Musculoskeletal:  ?   Cervical back: Full passive range of motion without pain, normal range of motion and neck supple.  ?Skin: ?   General: Skin is warm and dry.  ?Neurological:  ?   Mental Status: He is alert and oriented to person, place, and time.  ?Psychiatric:     ?   Attention and Perception: Attention normal.     ?   Mood and Affect: Mood normal.     ?   Speech: Speech normal.     ?   Behavior: Behavior is cooperative.  ? ? ? ? ?Assessment and Plan:  ?Alexander Kelley is a 31 y.o. male presenting to the [facility] Department for STI screening ? ?1. Screening examination for venereal disease ?-31 year old male seen today for treatment of Syphilis.  Declines all STD screening today due to previously having screening done.  Would only like treatment for Syphilis today.  ? ?2. Syphilis ?-Patient in clinic today for a positive RPR 1:64, previous testing done on 05/02/21 that was non-reactive which indicates a newly acquired Syphilis.  Patient to be  treated today with Bicillin 2.4 milli units x 1 dose.  Patient informed to report back to clinic in 6 months for blood work.  Advised patient no sex for 14 days.  ? ?- penicillin g benzathine (BICILLIN LA) 1200000 UNIT/2ML injection 2.4 Million Units  ? ? ? ? ?Return in about 6 months (around 06/07/2022), or blood work. ? ?No future appointments. ? ?Gregary Cromer, FNP ?

## 2021-12-12 ENCOUNTER — Telehealth: Payer: Self-pay | Admitting: Family Medicine

## 2021-12-12 ENCOUNTER — Encounter: Payer: Self-pay | Admitting: Physician Assistant

## 2021-12-12 NOTE — Telephone Encounter (Signed)
Pt would like to speak to a nurse about some new developments in his situation.  He didn't want to leave any details. ?

## 2021-12-13 ENCOUNTER — Telehealth: Payer: Self-pay

## 2021-12-13 NOTE — Telephone Encounter (Signed)
Phone call to pt due to [facility] receiving message through MyChart that he had urgent question.  Pt states that his question has already been answered and does not need any further assistance at this time. ?

## 2021-12-17 ENCOUNTER — Other Ambulatory Visit: Payer: Self-pay

## 2021-12-17 ENCOUNTER — Encounter: Payer: Self-pay | Admitting: Nurse Practitioner

## 2021-12-17 ENCOUNTER — Ambulatory Visit: Payer: Self-pay | Admitting: Nurse Practitioner

## 2021-12-17 DIAGNOSIS — Z113 Encounter for screening for infections with a predominantly sexual mode of transmission: Secondary | ICD-10-CM

## 2021-12-17 DIAGNOSIS — Z202 Contact with and (suspected) exposure to infections with a predominantly sexual mode of transmission: Secondary | ICD-10-CM

## 2021-12-17 MED ORDER — METRONIDAZOLE 500 MG PO TABS: 500.0000 mg | ORAL_TABLET | Freq: Two times a day (BID) | ORAL | 0 refills | Status: AC

## 2021-12-17 NOTE — Progress Notes (Signed)
[facility] Department ?STI clinic/screening visit ? ?Subjective:  ?Alexander Kelley is a 31 y.o. male being seen today for an STI screening visit. The patient reports they do not have symptoms.   ? ?Patient has the following medical conditions:   ?Patient Active Problem List  ? Diagnosis Date Noted  ? Diabetes mellitus without complication (HCC) 12/05/2021  ? ? ? ?Chief Complaint  ?Patient presents with  ? SEXUALLY TRANSMITTED DISEASE  ?  Pt states he has exposure to Trich.  Here for tx  ? ? ?HPI ? ?Patient reports to clinic today to be treated as a contact to [facility].  ? ?Does the patient or their partner desires a pregnancy in the next year? No ? ?Screening for MPX risk: ?Does the patient have an unexplained rash? No ?Is the patient MSM? No ?Does the patient endorse multiple sex partners or anonymous sex partners? No ?Did the patient have close or sexual contact with a person diagnosed with MPX? No ?Has the patient traveled outside the [place] where MPX is endemic? No ?Is there a high clinical suspicion for MPX-- evidenced by one of the following No ? -Unlikely to be chickenpox ? -Lymphadenopathy ? -Rash that present in same phase of evolution on any given body part ? ? ?See flowsheet for further details and programmatic requirements.  ? ? ?The following portions of the patient's history were reviewed and updated as appropriate: allergies, current medications, past medical history, past social history, past surgical history and problem list. ? ?Objective:  ?There were no vitals filed for this visit. ? ?Physical Exam ?Constitutional:   ?   Appearance: Normal appearance.  ?HENT:  ?   Head: Normocephalic.  ?   Right Ear: External ear normal.  ?   Left Ear: External ear normal.  ?   Nose: Nose normal.  ?   Mouth/Throat:  ?   Mouth: Mucous membranes are moist.  ?Pulmonary:  ?   Effort: Pulmonary effort is normal.  ?Abdominal:  ?   General: Abdomen is flat.  ?   Palpations: Abdomen is soft.  ?Genitourinary: ?    Comments: Deferred, declined genital exam.   ?Musculoskeletal:  ?   Cervical back: Full passive range of motion without pain, normal range of motion and neck supple.  ?Skin: ?   General: Skin is warm and dry.  ?Neurological:  ?   Mental Status: He is alert and oriented to person, place, and time.  ?Psychiatric:     ?   Attention and Perception: Attention normal.     ?   Mood and Affect: Mood normal.     ?   Speech: Speech normal.     ?   Behavior: Behavior is cooperative.  ? ? ? ? ?Assessment and Plan:  ?Alexander Kelley is a 31 y.o. male presenting to the [facility] Department for STI screening ? ?1. Screening examination for venereal disease ?-31 year old in clinic today desiring treatment for [facility].  Patient states partner tested positive for [facility].  Patient declines all STD screenings today, reports he is currently asymptomatic.   ? ?2. Exposure to trichomonas ?-Please treat patient as a contact to [facility].  Advised patient to abstain for sex for 7 days and 7 days until partner is treated.  Recommended condoms with all sex.  Return to clinic for any questions or concerns.   ? ?- metroNIDAZOLE (FLAGYL) 500 MG tablet; Take 1 tablet (500 mg total) by mouth 2 (two) times daily  for 7 days.  Dispense: 14 tablet; Refill: 0  ? ? ? ? ?Return if symptoms worsen or fail to improve. ? ?No future appointments. ? ?Glenna Fellows, FNP ?

## 2021-12-17 NOTE — Progress Notes (Signed)
Pt here for exposure to Trich and wants treatment only.  Providers orders completed and medication dispensed.  ?BWiedenheftRN ?

## 2023-01-04 DIAGNOSIS — Z0589 Observation and evaluation of newborn for other specified suspected condition ruled out: Secondary | ICD-10-CM | POA: Diagnosis not present

## 2023-01-04 DIAGNOSIS — Z051 Observation and evaluation of newborn for suspected infectious condition ruled out: Secondary | ICD-10-CM | POA: Diagnosis not present

## 2023-01-04 DIAGNOSIS — Z0572 Observation and evaluation of newborn for suspected musculoskeletal condition ruled out: Secondary | ICD-10-CM | POA: Diagnosis not present

## 2023-01-04 DIAGNOSIS — Q825 Congenital non-neoplastic nevus: Secondary | ICD-10-CM | POA: Diagnosis not present

## 2023-01-04 DIAGNOSIS — Q826 Congenital sacral dimple: Secondary | ICD-10-CM | POA: Diagnosis not present

## 2023-01-04 DIAGNOSIS — Z23 Encounter for immunization: Secondary | ICD-10-CM | POA: Diagnosis not present

## 2023-01-15 DIAGNOSIS — R768 Other specified abnormal immunological findings in serum: Secondary | ICD-10-CM | POA: Diagnosis not present

## 2023-01-15 DIAGNOSIS — Z00111 Health examination for newborn 8 to 28 days old: Secondary | ICD-10-CM | POA: Diagnosis not present

## 2024-04-25 ENCOUNTER — Emergency Department
Admission: EM | Admit: 2024-04-25 | Discharge: 2024-04-25 | Disposition: A | Discharge status: Home / Self Care | Payer: Self-pay | Attending: Emergency Medicine | Admitting: Emergency Medicine

## 2024-04-25 ENCOUNTER — Other Ambulatory Visit: Payer: Self-pay

## 2024-04-25 DIAGNOSIS — E1165 Type 2 diabetes mellitus with hyperglycemia: Secondary | ICD-10-CM | POA: Insufficient documentation

## 2024-04-25 DIAGNOSIS — E11649 Type 2 diabetes mellitus with hypoglycemia without coma: Secondary | ICD-10-CM

## 2024-04-25 DIAGNOSIS — H6591 Unspecified nonsuppurative otitis media, right ear: Secondary | ICD-10-CM | POA: Insufficient documentation

## 2024-04-25 LAB — CBG MONITORING, ED: Glucose-Capillary: 332 mg/dL — ABNORMAL HIGH (ref 70–99)

## 2024-04-25 MED ORDER — AMOXICILLIN-POT CLAVULANATE 875-125 MG PO TABS: 1.0000 | ORAL_TABLET | Freq: Two times a day (BID) | ORAL | 0 refills | Status: AC

## 2024-04-25 MED ORDER — AMOXICILLIN-POT CLAVULANATE 875-125 MG PO TABS
1.0000 | ORAL_TABLET | Freq: Once | ORAL | Status: AC
  Administered 2024-04-25: 1 via ORAL
  Filled 2024-04-25: qty 1

## 2024-04-25 MED ORDER — METFORMIN HCL 500 MG PO TABS: 500.0000 mg | ORAL_TABLET | Freq: Two times a day (BID) | ORAL | 2 refills | Status: AC

## 2024-04-25 NOTE — ED Provider Notes (Signed)
 [facility] Provider Note    Event Date/Time   First MD Initiated Contact with Patient 04/25/24 [phone]     (approximate)   History   Otalgia   HPI  Alexander Kelley is a 33 y.o. male reports a history of diabetes, now diet and exercise controlled.  Used to be on metformin .  The patient reports that for a week he had a runny nose sinus congestion and a slight cough.  Although symptoms went away but now it went to my right ear.  Patient reports he feels like he has a right ear infection.  He has pain in his right inner ear.  The rest of his symptoms reports are better is not having any fever cough body aches sore throat or other symptoms.  He does relate a history of diabetes and used to take metformin  but is no longer.  He also reports he is no longer following with primary care but his symptoms have been well-controlled with improvement in diet exercise and weight loss     Physical Exam   Triage Vital Signs: ED Triage Vitals  Encounter Vitals Group     BP 04/25/24 0313 (!) 167/88     Girls Systolic BP Percentile --      Girls Diastolic BP Percentile --      Boys Systolic BP Percentile --      Boys Diastolic BP Percentile --      Pulse Rate 04/25/24 0313 100     Resp 04/25/24 0313 15     Temp 04/25/24 0313 98.7 F (37.1 C)     Temp Source 04/25/24 0313 Oral     SpO2 04/25/24 0313 100 %     Weight --      Height --      Head Circumference --      Peak Flow --      Pain Score 04/25/24 0312 8     Pain Loc --      Pain Education --      Exclude from Growth Chart --     Most recent vital signs: Vitals:   04/25/24 0313  BP: (!) 167/88  Pulse: 100  Resp: 15  Temp: 98.7 F (37.1 C)  SpO2: 100%     General: Awake, no distress.  CV:  Good peripheral perfusion.  Normal tones of rate Resp:  Normal effort.  Clear bilateral with normal work of breathing.  Very pleasant Abd:  No distention.  Other:  Seated at the side of the bed he is very  pleasant alert well-oriented.  No effusion or tenderness over the mastoid.  External ear exams normal bilateral.  Normocephalic atraumatic.  Posterior oropharynx is normal without injection.  Normal tonsils.  No stridor breathing normally.  Left tympanic membrane negative for acute finding, slightly obscured by cerumen.  The right tympanic membrane is clearly visualizable and is erythematous and has outward bulging and evidence of middle ear effusion.   ED Results / Procedures / Treatments   Labs (all labs ordered are listed, but only abnormal results are displayed) Labs Reviewed  CBG MONITORING, ED     EKG     RADIOLOGY     PROCEDURES:  Critical Care performed: No  Procedures   MEDICATIONS ORDERED IN ED: Medications  amoxicillin -clavulanate (AUGMENTIN ) 875-125 MG per tablet 1 tablet (has no administration in time range)     IMPRESSION / MDM / ASSESSMENT AND PLAN / ED COURSE  I reviewed the triage vital  signs and the nursing notes.                              Differential diagnosis includes, but is not limited to, recent upper respiratory infection, sinusitis, right-sided otitis media, etc.  He denies headache.  Pain is very localized to the right ear and clinical exam findings consistent with otitis media of the right side.  No allergies.  Will start on Augmentin  for 10-day course.  Counseled patient on treatment recommendations antibiotics and careful return precautions.  He is agreeable with this plan.  Did recommend he reestablish PCP.  Check blood glucose as patient reports he has not been taking metformin  for some time, he is notably hyperglycemic.  He is not have any symptoms suggestive of acute complication of diabetes, DKA, polyuria etc. but counseled patient will restart his metformin  which she is agreeable with and strongly recommended he follow-up and establish primary care.  Patient understanding agreeable  Patient's presentation is most consistent with  acute, uncomplicated illness.          FINAL CLINICAL IMPRESSION(S) / ED DIAGNOSES   Final diagnoses:  Right otitis media with effusion     Rx / DC Orders   ED Discharge Orders          Ordered    Ambulatory Referral to Primary Care (Establish Care)        04/25/24 0331    amoxicillin -clavulanate (AUGMENTIN ) 875-125 MG tablet  2 times daily        04/25/24 0331             Note:  This document was prepared using Dragon voice recognition software and may include unintentional dictation errors.   Dicky Anes, MD 04/25/24 [phone]

## 2024-04-25 NOTE — ED Triage Notes (Addendum)
 Patient presents with right ear pain that started yesterday. Patient states he recently had a cold and the ear pain started after that. Denies fevers at home.
